# Patient Record
Sex: Female | Born: 1970 | Race: Black or African American | Hispanic: No | Smoking: Former smoker
Health system: Southern US, Community
[De-identification: ages and names within clinical notes are randomized; demographics above are authoritative.]

## PROBLEM LIST (undated history)

## (undated) DIAGNOSIS — E079 Disorder of thyroid, unspecified: Secondary | ICD-10-CM

## (undated) HISTORY — PX: KNEE SURGERY: SHX244

---

## 2004-10-29 ENCOUNTER — Emergency Department: Payer: Self-pay | Admitting: Emergency Medicine

## 2005-07-14 ENCOUNTER — Ambulatory Visit: Payer: Self-pay | Admitting: Internal Medicine

## 2005-10-25 ENCOUNTER — Emergency Department: Payer: Self-pay | Admitting: Emergency Medicine

## 2005-10-25 ENCOUNTER — Other Ambulatory Visit: Payer: Self-pay

## 2006-12-08 ENCOUNTER — Emergency Department: Payer: Self-pay | Admitting: General Practice

## 2012-12-15 ENCOUNTER — Emergency Department: Payer: Self-pay | Admitting: Emergency Medicine

## 2013-10-24 ENCOUNTER — Ambulatory Visit: Payer: Self-pay | Admitting: Unknown Physician Specialty

## 2013-12-18 ENCOUNTER — Ambulatory Visit: Payer: Self-pay | Admitting: Unknown Physician Specialty

## 2016-11-08 ENCOUNTER — Other Ambulatory Visit: Payer: Self-pay | Admitting: Family Medicine

## 2016-11-08 DIAGNOSIS — R51 Headache: Principal | ICD-10-CM

## 2016-11-08 DIAGNOSIS — R519 Headache, unspecified: Secondary | ICD-10-CM

## 2016-11-14 ENCOUNTER — Other Ambulatory Visit: Payer: Self-pay | Admitting: Family Medicine

## 2016-11-14 DIAGNOSIS — R519 Headache, unspecified: Secondary | ICD-10-CM

## 2016-11-14 DIAGNOSIS — R51 Headache: Principal | ICD-10-CM

## 2018-07-24 DIAGNOSIS — R0781 Pleurodynia: Secondary | ICD-10-CM | POA: Diagnosis not present

## 2018-07-24 DIAGNOSIS — J9811 Atelectasis: Secondary | ICD-10-CM | POA: Diagnosis not present

## 2018-07-24 DIAGNOSIS — R0789 Other chest pain: Secondary | ICD-10-CM | POA: Diagnosis not present

## 2018-07-25 DIAGNOSIS — J9811 Atelectasis: Secondary | ICD-10-CM | POA: Diagnosis not present

## 2018-07-25 DIAGNOSIS — R0781 Pleurodynia: Secondary | ICD-10-CM | POA: Diagnosis not present

## 2018-08-06 ENCOUNTER — Emergency Department
Admission: EM | Admit: 2018-08-06 | Discharge: 2018-08-06 | Disposition: A | Discharge status: Home / Self Care | Payer: Commercial Managed Care - PPO | Attending: Emergency Medicine | Admitting: Emergency Medicine

## 2018-08-06 ENCOUNTER — Other Ambulatory Visit: Payer: Self-pay

## 2018-08-06 ENCOUNTER — Encounter: Payer: Self-pay | Admitting: Emergency Medicine

## 2018-08-06 ENCOUNTER — Emergency Department: Payer: Commercial Managed Care - PPO

## 2018-08-06 DIAGNOSIS — Y929 Unspecified place or not applicable: Secondary | ICD-10-CM | POA: Insufficient documentation

## 2018-08-06 DIAGNOSIS — S29011A Strain of muscle and tendon of front wall of thorax, initial encounter: Secondary | ICD-10-CM | POA: Insufficient documentation

## 2018-08-06 DIAGNOSIS — Z9189 Other specified personal risk factors, not elsewhere classified: Secondary | ICD-10-CM | POA: Diagnosis not present

## 2018-08-06 DIAGNOSIS — S299XXA Unspecified injury of thorax, initial encounter: Secondary | ICD-10-CM | POA: Diagnosis present

## 2018-08-06 DIAGNOSIS — Y9389 Activity, other specified: Secondary | ICD-10-CM | POA: Diagnosis not present

## 2018-08-06 DIAGNOSIS — E079 Disorder of thyroid, unspecified: Secondary | ICD-10-CM | POA: Insufficient documentation

## 2018-08-06 DIAGNOSIS — R079 Chest pain, unspecified: Secondary | ICD-10-CM | POA: Diagnosis not present

## 2018-08-06 DIAGNOSIS — I2510 Atherosclerotic heart disease of native coronary artery without angina pectoris: Secondary | ICD-10-CM | POA: Diagnosis not present

## 2018-08-06 DIAGNOSIS — Y998 Other external cause status: Secondary | ICD-10-CM | POA: Insufficient documentation

## 2018-08-06 DIAGNOSIS — Z87891 Personal history of nicotine dependence: Secondary | ICD-10-CM | POA: Insufficient documentation

## 2018-08-06 DIAGNOSIS — R0789 Other chest pain: Secondary | ICD-10-CM

## 2018-08-06 DIAGNOSIS — X58XXXA Exposure to other specified factors, initial encounter: Secondary | ICD-10-CM | POA: Diagnosis not present

## 2018-08-06 HISTORY — DX: Disorder of thyroid, unspecified: E07.9

## 2018-08-06 LAB — CBC
HCT: 37.8 % (ref 36.0–46.0)
Hemoglobin: 12.3 g/dL (ref 12.0–15.0)
MCH: 29.4 pg (ref 26.0–34.0)
MCHC: 32.5 g/dL (ref 30.0–36.0)
MCV: 90.2 fL (ref 80.0–100.0)
NRBC: 0 % (ref 0.0–0.2)
Platelets: 355 10*3/uL (ref 150–400)
RBC: 4.19 MIL/uL (ref 3.87–5.11)
RDW: 13 % (ref 11.5–15.5)
WBC: 7.6 10*3/uL (ref 4.0–10.5)

## 2018-08-06 LAB — BASIC METABOLIC PANEL
Anion gap: 7 (ref 5–15)
BUN: 9 mg/dL (ref 6–20)
CHLORIDE: 105 mmol/L (ref 98–111)
CO2: 23 mmol/L (ref 22–32)
Calcium: 9.4 mg/dL (ref 8.9–10.3)
Creatinine, Ser: 0.64 mg/dL (ref 0.44–1.00)
GFR calc non Af Amer: >60 mL/min (ref >60)
Glucose, Bld: 102 mg/dL — ABNORMAL HIGH (ref 70–99)
Potassium: 3.6 mmol/L (ref 3.5–5.1)
SODIUM: 135 mmol/L (ref 135–145)

## 2018-08-06 LAB — FIBRIN DERIVATIVES D-DIMER (ARMC ONLY): FIBRIN DERIVATIVES D-DIMER (ARMC): 361.39 ng{FEU}/mL (ref 0.00–499.00)

## 2018-08-06 LAB — TROPONIN I: Troponin I: <0.03 ng/mL (ref <0.03)

## 2018-08-06 MED ORDER — IBUPROFEN 600 MG PO TABS
600.0000 mg | ORAL_TABLET | ORAL | Status: AC
  Administered 2018-08-06: 600 mg via ORAL
  Filled 2018-08-06: qty 1

## 2018-08-06 MED ORDER — HYDROCODONE-ACETAMINOPHEN 5-325 MG PO TABS: 1.0000 | ORAL_TABLET | Freq: Four times a day (QID) | ORAL | 0 refills | Status: AC | PRN

## 2018-08-06 MED ORDER — HYDROCODONE-ACETAMINOPHEN 5-325 MG PO TABS
1.0000 | ORAL_TABLET | Freq: Once | ORAL | Status: AC
  Administered 2018-08-06: 1 via ORAL
  Filled 2018-08-06: qty 1

## 2018-08-06 MED ORDER — LIDOCAINE 5 % EX PTCH
1.0000 | MEDICATED_PATCH | CUTANEOUS | Status: DC
  Administered 2018-08-06: 1 via TRANSDERMAL
  Filled 2018-08-06: qty 1

## 2018-08-06 NOTE — ED Provider Notes (Signed)
[facility] Emergency Department Provider Note   ____________________________________________   First MD Initiated Contact with Patient 08/06/18 1540     (approximate)  I have reviewed the triage vital signs and the nursing notes.   HISTORY  Chief Complaint Chest Pain    HPI Jamie Jarvis is a 48 y.o. female here for evaluation of left-sided chest pain  Patient reports about 2 to 3 weeks ago she had a cough, "cold" runny nose and began experiencing pain on the left side of her ribs.  She was seen in urgent care given prescription for tramadol and taking Aleve, but she is continued to experience pain in the same area.  No shortness of breath, except for the pain seems most strong.  No fevers or chills.  She did initially have symptoms such as fever and chills but he is gone away.  Reports he just has ongoing pain in her left chest is fairly sharp located sort of around the left breast left armpit region.  There is no rash.  No history of heart disease.  Reports she is very healthy.  Does not take any estrogens.  No history of blood clots.  No leg swelling.  No long trips or travel.  Non-smoker.  Denies history of hypertension, high cholesterol, or personal heart disease.  Past Medical History:  Diagnosis Date  . Thyroid disease     There are no active problems to display for this patient.   Past Surgical History:  Procedure Laterality Date  . KNEE SURGERY      Prior to Admission medications   Medication Sig Start Date End Date Taking? Authorizing Provider  HYDROcodone-acetaminophen (NORCO/VICODIN) 5-325 MG tablet Take 1 tablet by mouth every 6 (six) hours as needed for moderate pain. 08/06/18   Sharyn Creamer, MD    Allergies Patient has no known allergies.  History reviewed. No pertinent family history.  Social History Social History   Tobacco Use  . Smoking status: Former Games developer  . Smokeless tobacco: Never Used  Substance Use Topics   . Alcohol use: Never    Frequency: Never  . Drug use: Never    Review of Systems Constitutional: No fever/chills Eyes: No visual changes. ENT: No sore throat. Cardiovascular: Sharp pain in the left side of the chest present for about 2 weeks now, little bit worse last few days Respiratory: Denies shortness of breath. Gastrointestinal: No abdominal pain.   Genitourinary: Negative for dysuria. Musculoskeletal: Negative for back pain. Skin: Negative for rash. Neurological: Negative for headaches, areas of focal weakness or numbness.    ____________________________________________   PHYSICAL EXAM:  VITAL SIGNS: ED Triage Vitals  Enc Vitals Group     BP 08/06/18 1425 127/64     Pulse Rate 08/06/18 1425 65     Resp 08/06/18 1425 18     Temp 08/06/18 1425 98.9 F (37.2 C)     Temp Source 08/06/18 1425 Oral     SpO2 08/06/18 1425 100 %     Weight 08/06/18 1421 185 lb (83.9 kg)     Height 08/06/18 1421 5\' 4"  (1.626 m)     Head Circumference --      Peak Flow --      Pain Score 08/06/18 1421 7     Pain Loc --      Pain Edu? --      Excl. in GC? --     Constitutional: Alert and oriented. Well appearing and in no acute distress. Eyes:  Conjunctivae are normal. Head: Atraumatic. Nose: No congestion/rhinnorhea. Mouth/Throat: Mucous membranes are moist. Neck: No stridor.  Cardiovascular: Normal rate, regular rhythm. Grossly normal heart sounds.  Good peripheral circulation.  Some slight pleuritic component of pain, when taking deep respirations she reports a sharp pain in the left side of the chest. Respiratory: Normal respiratory effort.  No retractions. Lungs CTAB. Gastrointestinal: Soft and nontender. No distention. Musculoskeletal: No lower extremity tenderness nor edema. Neurologic:  Normal speech and language. No gross focal neurologic deficits are appreciated.  Skin:  Skin is warm, dry and intact. No rash noted. Psychiatric: Mood and affect are normal. Speech and  behavior are normal.  ____________________________________________   LABS (all labs ordered are listed, but only abnormal results are displayed)  Labs Reviewed  BASIC METABOLIC PANEL - Abnormal; Notable for the following components:      Result Value   Glucose, Bld 102 (*)    All other components within normal limits  CBC  TROPONIN I  FIBRIN DERIVATIVES D-DIMER ([facility] ONLY)   ____________________________________________  EKG  ED ECG REPORT I, Sharyn CreamerMark Glendine Swetz, the attending physician, personally viewed and interpreted this ECG.  Date: 08/06/2018 EKG Time: 1425 Rate: 63 Rhythm: normal sinus rhythm QRS Axis: normal Intervals: normal ST/T Wave abnormalities: normal Narrative Interpretation: no evidence of acute ischemia  ____________________________________________  RADIOLOGY  Dg Chest 2 View  Result Date: 08/06/2018 CLINICAL DATA:  Chest pain. EXAM: CHEST - 2 VIEW COMPARISON:  Radiographs of Oct 25, 2005. FINDINGS: The heart size and mediastinal contours are within normal limits. Both lungs are clear. No pneumothorax or pleural effusion is noted. The visualized skeletal structures are unremarkable. IMPRESSION: No active cardiopulmonary disease. Electronically Signed   By: Lupita RaiderJames  Green Jr, M.D.   On: 08/06/2018 15:10    ____________________________________________   PROCEDURES  Procedure(s) performed: None  Procedures  Critical Care performed: No  ____________________________________________   INITIAL IMPRESSION / ASSESSMENT AND PLAN / ED COURSE  Pertinent labs & imaging results that were available during my care of the patient were reviewed by me and considered in my medical decision making (see chart for details).   Differential diagnosis includes, but is not limited to, ACS, aortic dissection, pulmonary embolism, cardiac tamponade, pneumothorax, pneumonia, pericarditis, myocarditis, GI-related causes including esophagitis/gastritis, and musculoskeletal chest wall  pain.  Suspect likely musculoskeletal in nature based on clinical history of recent cough, cold infections.  No evidence of pneumonia.  EKG and troponin normal, low risk for ACS and her symptoms are not consistent with coronary syndrome been present for several weeks now.  Patient is low risk by Wells criteria, d-dimer negative and no risk for pulmonary embolism is noted ----------------------------------------- 6:34 PM on 08/06/2018 -----------------------------------------  I will prescribe the patient a narcotic pain medicine due to their condition which I anticipate will cause at least moderate pain short term. I discussed with the patient safe use of narcotic pain medicines, and that they are not to drive, work in dangerous areas, or ever take more than prescribed (no more than 1 pill every 6 hours). We discussed that this is the type of medication that can be  overdosed on and the risks of this type of medicine. Patient is very agreeable to only use as prescribed and to never use more than prescribed.  Patient feels much better, reports her pain essentially gone after taking hydrocodone.  Discussed with patient I suspect likely this is musculoskeletal rib strain, we did however discussed very careful return precautions and follow-up plan.  Patient  is not driving herself, agreeable not to drive while taking hydrocodone and only use as prescribed.  Return precautions and treatment recommendations and follow-up discussed with the patient who is agreeable with the plan.        ____________________________________________   FINAL CLINICAL IMPRESSION(S) / ED DIAGNOSES  Final diagnoses:  Atypical chest pain  Acute nonspecific chest pain with low risk of coronary artery disease  Chest wall muscle strain, initial encounter        Note:  This document was prepared using Dragon voice recognition software and may include unintentional dictation errors       Sharyn Creamer, MD 08/06/18  2000

## 2018-08-06 NOTE — ED Triage Notes (Signed)
Here for chest pain and SHOB for last 2 weeks. Seen at urgent care and told inflammation.  Today patient reports pain got worsee and started with sharp stabbing pain to left midaxillary/midclavicular area under breast.  Pt reports cannot take a deep breath.  No recent travel.  Not on any hormones.  Tearful. Unlabored. Taking shallow breaths.

## 2018-08-06 NOTE — ED Notes (Signed)
ED Provider at bedside. 

## 2019-11-08 ENCOUNTER — Ambulatory Visit: Payer: Commercial Managed Care - PPO | Attending: Internal Medicine

## 2019-11-08 DIAGNOSIS — Z23 Encounter for immunization: Secondary | ICD-10-CM

## 2019-11-08 NOTE — Progress Notes (Signed)
   Covid-19 Vaccination Clinic  Name:  Jamie Jarvis    MRN: [id number] DOB: 03/15/1971  11/08/2019  Ms. Jamie Jarvis was observed post Covid-19 immunization for 15 minutes without incident. She was provided with Vaccine Information Sheet and instruction to access the V-Safe system.   Ms. Jamie Jarvis was instructed to call 911 with any severe reactions post vaccine: Marland Kitchen Difficulty breathing  . Swelling of face and throat  . A fast heartbeat  . A bad rash all over body  . Dizziness and weakness   Immunizations Administered    Name Date Dose VIS Date Route   Pfizer COVID-19 Vaccine 11/08/2019  9:52 AM 0.3 mL 08/21/2018 Intramuscular   Manufacturer: [employer], [employer]   Lot: [id number]   NDC: [address]-3

## 2019-11-29 ENCOUNTER — Ambulatory Visit: Payer: Commercial Managed Care - PPO | Attending: Internal Medicine

## 2019-11-29 DIAGNOSIS — Z23 Encounter for immunization: Secondary | ICD-10-CM

## 2019-11-29 NOTE — Progress Notes (Signed)
   Covid-19 Vaccination Clinic  Name:  Jamie Jarvis    MRN: [id number] DOB: 16-Jul-1970  11/29/2019  Ms. Jamie Jarvis was observed post Covid-19 immunization for 15 minutes without incident. She was provided with Vaccine Information Sheet and instruction to access the V-Safe system.   Ms. Jamie Jarvis was instructed to call 911 with any severe reactions post vaccine: Marland Kitchen Difficulty breathing  . Swelling of face and throat  . A fast heartbeat  . A bad rash all over body  . Dizziness and weakness   Immunizations Administered    Name Date Dose VIS Date Route   Pfizer COVID-19 Vaccine 11/29/2019  8:05 AM 0.3 mL 08/21/2018 Intramuscular   Manufacturer: [employer], [employer]   Lot: [id number]   NDC: [address]-4

## 2020-04-03 ENCOUNTER — Encounter: Payer: Self-pay | Admitting: Emergency Medicine

## 2020-04-03 ENCOUNTER — Emergency Department
Admission: EM | Admit: 2020-04-03 | Discharge: 2020-04-03 | Disposition: A | Discharge status: Home / Self Care | Payer: Commercial Managed Care - PPO | Attending: Emergency Medicine | Admitting: Emergency Medicine

## 2020-04-03 ENCOUNTER — Other Ambulatory Visit: Payer: Self-pay

## 2020-04-03 ENCOUNTER — Emergency Department: Payer: Commercial Managed Care - PPO

## 2020-04-03 DIAGNOSIS — R0789 Other chest pain: Secondary | ICD-10-CM | POA: Diagnosis present

## 2020-04-03 DIAGNOSIS — E039 Hypothyroidism, unspecified: Secondary | ICD-10-CM | POA: Insufficient documentation

## 2020-04-03 DIAGNOSIS — Z87891 Personal history of nicotine dependence: Secondary | ICD-10-CM | POA: Insufficient documentation

## 2020-04-03 DIAGNOSIS — R079 Chest pain, unspecified: Secondary | ICD-10-CM

## 2020-04-03 LAB — BASIC METABOLIC PANEL
Anion gap: 5 (ref 5–15)
BUN: 9 mg/dL (ref 6–20)
CO2: 29 mmol/L (ref 22–32)
Calcium: 9.1 mg/dL (ref 8.9–10.3)
Chloride: 103 mmol/L (ref 98–111)
Creatinine, Ser: 0.58 mg/dL (ref 0.44–1.00)
GFR, Estimated: >60 mL/min (ref >60)
Glucose, Bld: 91 mg/dL (ref 70–99)
Potassium: 3.6 mmol/L (ref 3.5–5.1)
Sodium: 137 mmol/L (ref 135–145)

## 2020-04-03 LAB — CBC
HCT: 33.7 % — ABNORMAL LOW (ref 36.0–46.0)
Hemoglobin: 11 g/dL — ABNORMAL LOW (ref 12.0–15.0)
MCH: 28.5 pg (ref 26.0–34.0)
MCHC: 32.6 g/dL (ref 30.0–36.0)
MCV: 87.3 fL (ref 80.0–100.0)
Platelets: 261 10*3/uL (ref 150–400)
RBC: 3.86 MIL/uL — ABNORMAL LOW (ref 3.87–5.11)
RDW: 13.8 % (ref 11.5–15.5)
WBC: 4.7 10*3/uL (ref 4.0–10.5)
nRBC: 0 % (ref 0.0–0.2)

## 2020-04-03 LAB — TROPONIN I (HIGH SENSITIVITY)
Troponin I (High Sensitivity): 3 ng/L (ref <18)
Troponin I (High Sensitivity): 4 ng/L (ref <18)

## 2020-04-03 MED ORDER — PANTOPRAZOLE SODIUM 20 MG PO TBEC: 20.0000 mg | DELAYED_RELEASE_TABLET | Freq: Every day | ORAL | 1 refills | Status: AC

## 2020-04-03 NOTE — ED Notes (Signed)
Patient assessed. Reports midsternal chest pain now resolved. Reports pain has been intermittent x 2-3 weeks without radiation and described as pins and needles sensation. Reports worsens when laying down and sometimes after eating. Reports relief with belching. Patient is alert and oriented and following commands appropriately. Pt is breathing easy and unlabored speaking in full sentences and with noted symmetric chest rise and fall. Denies cough, congestion, or SOB. Pt is in bed with bed low and locked and side rails raised x1. Call bell in reach.

## 2020-04-03 NOTE — ED Notes (Signed)
See triage note. Pt in with CP which is relieved after burping. Pt resting calmly in bed. Skin dry. Resp reg/unlabored.

## 2020-04-03 NOTE — ED Triage Notes (Signed)
PT to ER with c/o intermittent chest pain for last several weeks that was worse last night and today.  PT states pain is nonradiating midsternal relieved with belching.  PT denies other s/s.  PT states pain is worse when she lays down.

## 2020-04-03 NOTE — Discharge Instructions (Addendum)
Please call the number provided for cardiology to discuss follow-up appointment for possible stress test.

## 2020-04-03 NOTE — ED Provider Notes (Signed)
[facility] Emergency Department Provider Note  Time seen: 4:26 PM  I have reviewed the triage vital signs and the nursing notes.   HISTORY  Chief Complaint Chest Pain   HPI Jamie Jarvis is a 49 y.o. female with a past medical history of hypothyroidism presents to the emergency department for chest pain.  According to the patient for the past week or so she has been experiencing chest discomfort when she lies down at night.  States she took Rolaids last night which seemed to get rid of the discomfort.  However patient was concerned that it could be her heart so she came to the emergency department for evaluation.  Patient denies any chest pain currently.  Denies any nausea or shortness of breath or diaphoresis at any point.  Negative review of systems.  Describes the pain as a moderate sharp type pain when it does occur, but again none currently.   Past Medical History:  Diagnosis Date  . Thyroid disease     There are no problems to display for this patient.   Past Surgical History:  Procedure Laterality Date  . KNEE SURGERY      Prior to Admission medications   Medication Sig Start Date End Date Taking? Authorizing Provider  HYDROcodone-acetaminophen (NORCO/VICODIN) 5-325 MG tablet Take 1 tablet by mouth every 6 (six) hours as needed for moderate pain. 08/06/18   Sharyn Creamer, MD    No Known Allergies  History reviewed. No pertinent family history.  Social History Social History   Tobacco Use  . Smoking status: Former Games developer  . Smokeless tobacco: Never Used  Substance Use Topics  . Alcohol use: Never  . Drug use: Never    Review of Systems Constitutional: Negative for fever. Cardiovascular: Intermittent chest pain x1 week Respiratory: Negative for shortness of breath. Gastrointestinal: Negative for abdominal pain, vomiting and diarrhea. Musculoskeletal: Negative for musculoskeletal complaints Neurological: Negative for headache All  other ROS negative  ____________________________________________   PHYSICAL EXAM:  VITAL SIGNS: ED Triage Vitals  Enc Vitals Group     BP 04/03/20 1309 (!) 141/61     Pulse Rate 04/03/20 1309 (!) 50     Resp 04/03/20 1309 18     Temp 04/03/20 1309 98.9 F (37.2 C)     Temp Source 04/03/20 1309 Oral     SpO2 04/03/20 1309 100 %     Weight 04/03/20 1311 187 lb (84.8 kg)     Height 04/03/20 1311 5\' 4"  (1.626 m)     Head Circumference --      Peak Flow --      Pain Score 04/03/20 1310 2     Pain Loc --      Pain Edu? --      Excl. in GC? --     Constitutional: Alert and oriented. Well appearing and in no distress. Eyes: Normal exam ENT      Head: Normocephalic and atraumatic.      Mouth/Throat: Mucous membranes are moist. Cardiovascular: Normal rate, regular rhythm.  Respiratory: Normal respiratory effort without tachypnea nor retractions. Breath sounds are clear Gastrointestinal: Soft and nontender. No distention. Musculoskeletal: Nontender with normal range of motion in all extremities. Neurologic:  Normal speech and language. No gross focal neurologic deficits are appreciated. Skin:  Skin is warm, dry and intact.  Psychiatric: Mood and affect are normal. Speech and behavior are normal.   ____________________________________________    EKG  EKG viewed and interpreted by myself shows a normal  sinus rhythm at 56 bpm with a narrow QRS, normal axis, normal intervals, no concerning ST changes.  Reassuring EKG.  ____________________________________________    RADIOLOGY  Chest x-ray is negative I have personally reviewed the patient's chest x-ray images, no acute findings on my evaluation.  ____________________________________________   INITIAL IMPRESSION / ASSESSMENT AND PLAN / ED COURSE  Pertinent labs & imaging results that were available during my care of the patient were reviewed by me and considered in my medical decision making (see chart for details).    Patient presents to the emergency department for chest pain intermittent over the past 1 week states it only occurs when she lies down at night, relieved after taking Rolaids last night.  Reassuringly patient appears well with a normal physical exam reassuring vitals and reassuring lab work including negative troponin x2.  Normal-appearing x-ray and a reassuring EKG.  Given the patient's reassuring work-up I discussed outpatient follow-up with a cardiologist for stress test.  I will also place the patient on a PPI and recommended use of over-the-counter liquid Maalox for the next 1 week.  I discussed my typical chest pain return precautions.  Patient agreeable to plan of care.  Jamie Jarvis was evaluated in Emergency Department on 04/03/2020 for the symptoms described in the history of present illness. She was evaluated in the context of the global COVID-19 pandemic, which necessitated consideration that the patient might be at risk for infection with the SARS-CoV-2 virus that causes COVID-19. Institutional protocols and algorithms that pertain to the evaluation of patients at risk for COVID-19 are in a state of rapid change based on information released by regulatory bodies including the CDC and federal and state organizations. These policies and algorithms were followed during the patient's care in the ED.  ____________________________________________   FINAL CLINICAL IMPRESSION(S) / ED DIAGNOSES  Chest pain   Minna Antis, MD 04/03/20 1629

## 2020-08-26 IMAGING — CR DG CHEST 2V
1 series · 2 of 2 positions shown · non-contrast
Comparison: Radiographs October 25, 2005.

CLINICAL DATA: Chest pain.

EXAM:
CHEST - 2 VIEW

[Series 1: w chest pa · 0.14mm/px · 2 of 2 slices shown]
[im 1/2]
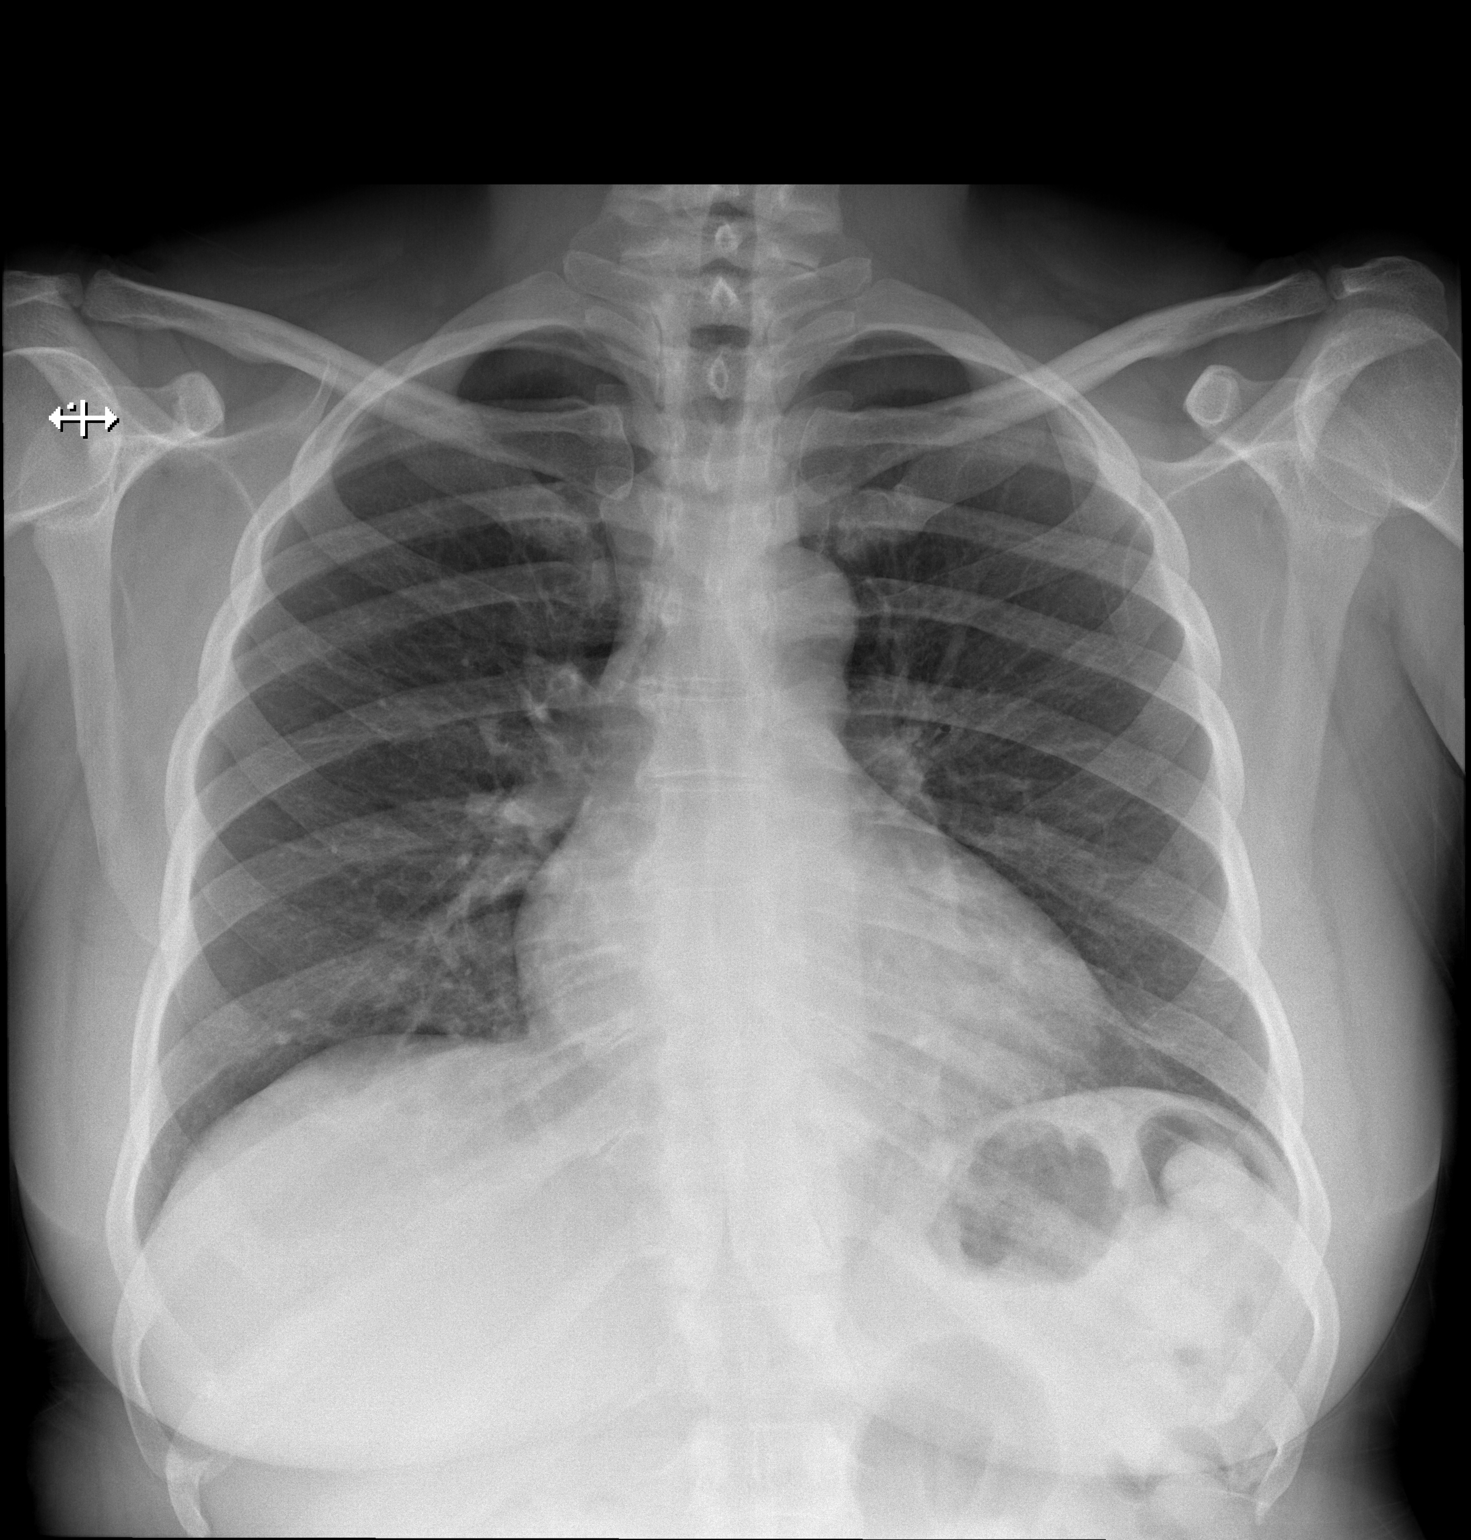
[im 2/2]
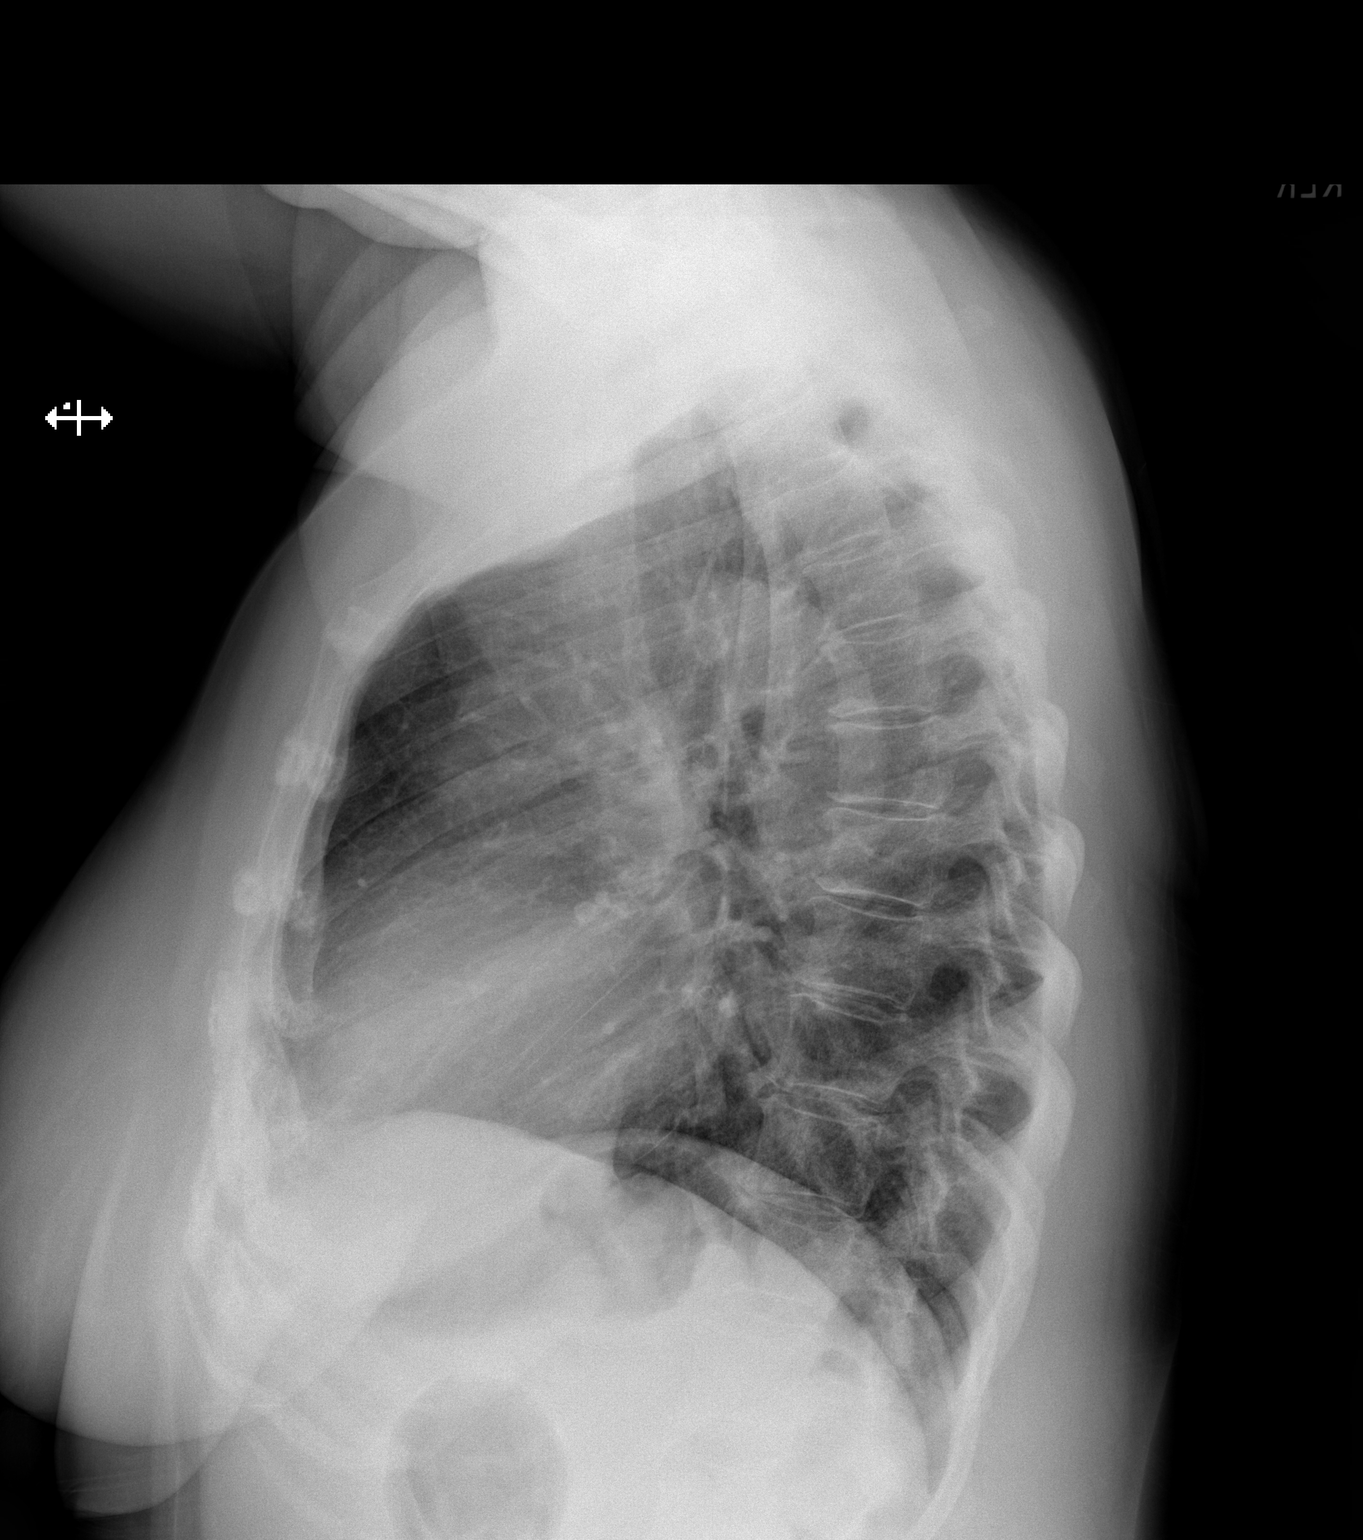

[2 of 2 positions shown; findings below may reference images not displayed]

FINDINGS: The heart size and mediastinal contours are within normal limits.
Both lungs are clear. No pneumothorax or pleural effusion is noted.
The visualized skeletal structures are unremarkable.
IMPRESSION: No active cardiopulmonary disease.

## 2024-04-24 ENCOUNTER — Other Ambulatory Visit: Payer: Self-pay

## 2024-04-24 DIAGNOSIS — Z1231 Encounter for screening mammogram for malignant neoplasm of breast: Secondary | ICD-10-CM

## 2024-04-25 ENCOUNTER — Ambulatory Visit
Admission: RE | Admit: 2024-04-25 | Discharge: 2024-04-25 | Disposition: A | Discharge status: Home / Self Care | Source: Ambulatory Visit | Attending: Family Medicine | Admitting: Family Medicine

## 2024-04-25 ENCOUNTER — Other Ambulatory Visit: Payer: Self-pay | Admitting: Family Medicine

## 2024-04-25 DIAGNOSIS — Z1231 Encounter for screening mammogram for malignant neoplasm of breast: Secondary | ICD-10-CM
